# Patient Record
Sex: Female | Born: 1995 | Race: Black or African American | Hispanic: No | Smoking: Never smoker
Health system: Southern US, Community
[De-identification: ages and names within clinical notes are randomized; demographics above are authoritative.]

---
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
## 2017-01-05 ENCOUNTER — Ambulatory Visit (HOSPITAL_COMMUNITY)
Admission: EM | Admit: 2017-01-05 | Discharge: 2017-01-05 | Disposition: A | Discharge status: Home / Self Care | Attending: Internal Medicine | Admitting: Internal Medicine

## 2017-01-05 ENCOUNTER — Encounter (HOSPITAL_COMMUNITY): Payer: Self-pay | Admitting: Emergency Medicine

## 2017-01-05 DIAGNOSIS — R69 Illness, unspecified: Secondary | ICD-10-CM | POA: Diagnosis not present

## 2017-01-05 DIAGNOSIS — J111 Influenza due to unidentified influenza virus with other respiratory manifestations: Secondary | ICD-10-CM

## 2017-01-05 MED ORDER — OSELTAMIVIR PHOSPHATE 75 MG PO CAPS
75.0000 mg | ORAL_CAPSULE | Freq: Two times a day (BID) | ORAL | 0 refills | Status: AC
Start: 2017-01-05 — End: ?

## 2017-01-05 MED ORDER — ONDANSETRON 4 MG PO TBDP
4.0000 mg | ORAL_TABLET | Freq: Three times a day (TID) | ORAL | 0 refills | Status: AC | PRN
Start: 2017-01-05 — End: ?

## 2017-01-05 MED ORDER — BENZONATATE 100 MG PO CAPS: 100.0000 mg | ORAL_CAPSULE | Freq: Three times a day (TID) | ORAL | 0 refills | Status: AC

## 2017-01-05 NOTE — Discharge Instructions (Signed)
You most likely have a viral illness such as influenza, or an influenza-like illness, I advise rest, plenty of fluids and management of symptoms with over the counter medicines. For symptoms you may take Tylenol as needed every 4-6 hours for body aches or fever, not to exceed 4,000 mg a day, Take mucinex or mucinex DM ever 12 hours with a full glass of water, you may use an inhaled steroid such as Flonase, 2 sprays each nostril once a day for congestion, or an antihistamine such as Claritin or Zyrtec once a day. For cough, I have prescribed a medication called Tessalon. Take 1 tablet every 8 hours as needed for your cough. For treatment of influenza, I have prescribed Tamiflu. Take 1 tablet twice a day for 5 days. For Nausea, I have prescribed Zofran, take 1 tablet under the tongue every 8 hours as needed. Should your symptoms worsen or fail to resolve, follow up with your primary care provider or return to clinic.

## 2017-01-05 NOTE — ED Provider Notes (Signed)
CSN: [id number]     Arrival date & time 01/05/17  1952 History   None    Chief Complaint  Patient presents with  . Cough   (Consider location/radiation/quality/duration/timing/severity/associated sxs/prior Treatment) 21 year old female presents to clinic for evaluation of flulike symptoms   The history is provided by the patient.  Cough  Cough characteristics:  Productive and dry Sputum characteristics:  Clear Severity:  Moderate Onset quality:  Gradual Duration:  1 day Timing:  Constant Progression:  Unchanged Chronicity:  New Smoker: no   Context: upper respiratory infection   Relieved by:  Nothing Worsened by:  Nothing Ineffective treatments: Mucinex. Associated symptoms: chills, fever, headaches, myalgias, rhinorrhea, sinus congestion and sore throat   Associated symptoms: no shortness of breath and no wheezing     History reviewed. No pertinent past medical history. History reviewed. No pertinent surgical history. History reviewed. No pertinent family history. Social History  Substance Use Topics  . Smoking status: Never Smoker  . Smokeless tobacco: Not on file  . Alcohol use No   OB History    No data available     Review of Systems  Constitutional: Positive for chills and fever.  HENT: Positive for congestion, rhinorrhea and sore throat.   Eyes: Negative.   Respiratory: Positive for cough. Negative for shortness of breath and wheezing.   Cardiovascular: Negative.   Gastrointestinal: Positive for nausea and vomiting. Negative for abdominal pain and diarrhea.  Genitourinary: Negative.   Musculoskeletal: Positive for myalgias. Negative for neck pain and neck stiffness.  Skin: Negative.   Neurological: Positive for headaches. Negative for dizziness and light-headedness.    Allergies  Patient has no known allergies.  Home Medications   Prior to Admission medications   Medication Sig Start Date End Date Taking? Authorizing Provider  benzonatate  (TESSALON) 100 MG capsule Take 1 capsule (100 mg total) by mouth every 8 (eight) hours. 01/05/17   Dorena Bodo, NP  ondansetron (ZOFRAN ODT) 4 MG disintegrating tablet Take 1 tablet (4 mg total) by mouth every 8 (eight) hours as needed for nausea or vomiting. 01/05/17   Dorena Bodo, NP  oseltamivir (TAMIFLU) 75 MG capsule Take 1 capsule (75 mg total) by mouth every 12 (twelve) hours. 01/05/17   Dorena Bodo, NP   Meds Ordered and Administered this Visit  Medications - No data to display  BP 130/74 (BP Location: Left Arm)   Pulse 84   Temp 99.2 F (37.3 C) (Oral)   Resp 14   SpO2 98%  No data found.   Physical Exam  Constitutional: She is oriented to person, place, and time. She appears well-developed and well-nourished. She appears ill. No distress.  HENT:  Head: Normocephalic and atraumatic.  Right Ear: Tympanic membrane and external ear normal.  Left Ear: Tympanic membrane and external ear normal.  Nose: Nose normal. Right sinus exhibits no maxillary sinus tenderness and no frontal sinus tenderness. Left sinus exhibits no maxillary sinus tenderness and no frontal sinus tenderness.  Mouth/Throat: Uvula is midline and oropharynx is clear and moist. No oropharyngeal exudate.  Eyes: Conjunctivae are normal. Right eye exhibits no discharge. Left eye exhibits no discharge.  Neck: Normal range of motion. Neck supple. No JVD present.  Cardiovascular: Normal rate and regular rhythm.   Pulmonary/Chest: Effort normal and breath sounds normal. No respiratory distress. She has no wheezes.  Abdominal: Soft. Bowel sounds are normal. She exhibits no distension. There is no tenderness. There is no guarding.  Lymphadenopathy:    She has no  cervical adenopathy.  Neurological: She is alert and oriented to person, place, and time.  Skin: Skin is warm and dry. Capillary refill takes less than 2 seconds. She is not diaphoretic.  Psychiatric: She has a normal mood and affect. Her behavior is  normal.  Nursing note and vitals reviewed.   Urgent Care Course     Procedures (including critical care time)  Labs Review Labs Reviewed - No data to display  Imaging Review No results found.     MDM   1. Influenza-like illness     Treating for influenza, given Tamiflu, Tessalon, Zofran, provided counseling on over-the-counter therapies for symptom management. Given school note, advised follow-up with primary care if symptoms persist past one week, return to clinic.     Dorena Bodo, NP 01/05/17 2047

## 2017-01-05 NOTE — ED Triage Notes (Signed)
The patient presented to the [facility] with a complaint of a cough, congestion and fever.

## 2017-01-25 IMAGING — CR DG CHEST 2V
2 series · 2 of 2 positions shown · non-contrast
Comparison: None.

CLINICAL DATA: Cough for 6 days, rule out pneumonia.

EXAM:
CHEST  2 VIEW

[chest pa]
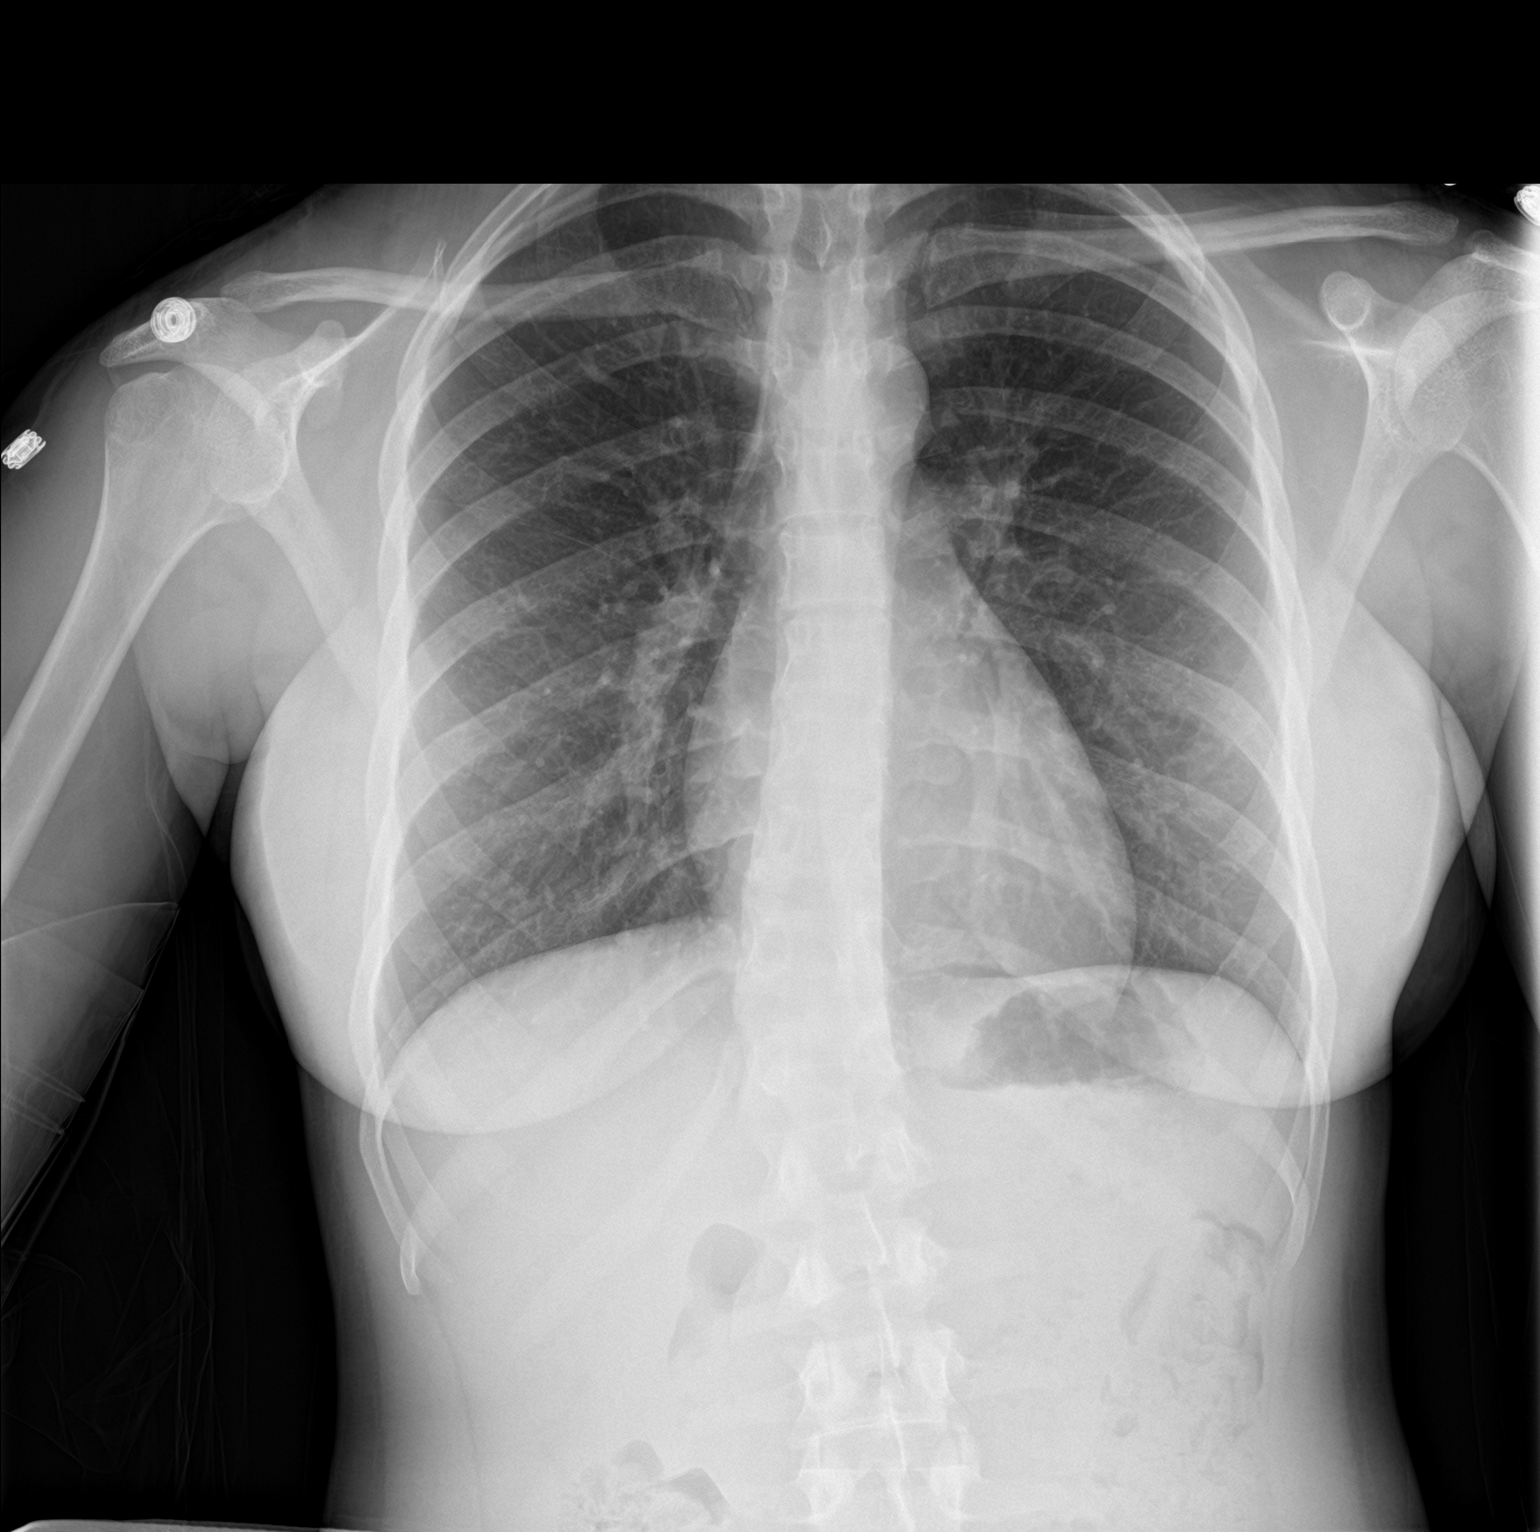

[chest lat]
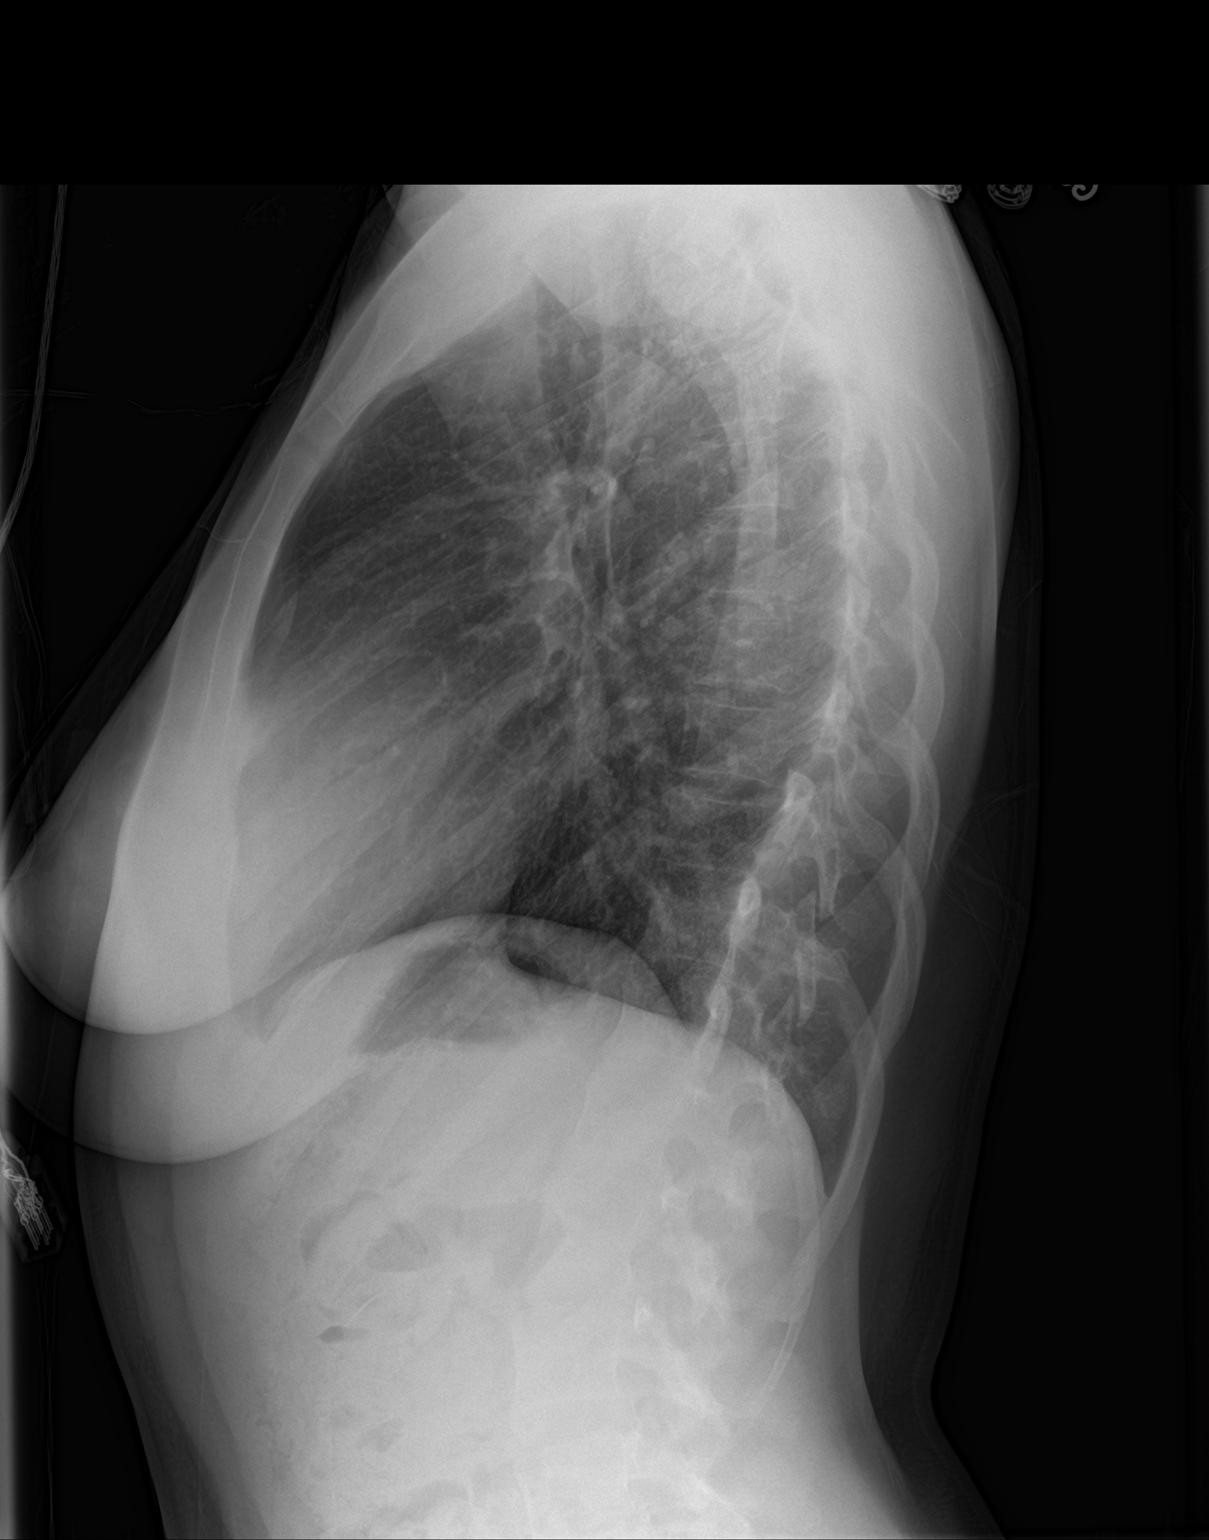

[2 of 2 positions shown; findings below may reference images not displayed]

FINDINGS: The cardiomediastinal contours are normal. The lungs are clear.
Pulmonary vasculature is normal. No consolidation, pleural effusion,
or pneumothorax. No acute osseous abnormalities are seen. Mild
dextroscoliotic curvature of the thoracolumbar spine.
IMPRESSION: 1. Clear lungs.  No pneumonia.
2. Mild dextroscoliotic curvature of the thoracolumbar spine.

## 2018-07-17 ENCOUNTER — Other Ambulatory Visit: Payer: Self-pay

## 2018-07-17 ENCOUNTER — Encounter (HOSPITAL_COMMUNITY): Payer: Self-pay

## 2018-07-17 ENCOUNTER — Emergency Department (HOSPITAL_COMMUNITY)
Admission: EM | Admit: 2018-07-17 | Discharge: 2018-07-18 | Disposition: A | Discharge status: Home / Self Care | Payer: BLUE CROSS/BLUE SHIELD | Attending: Emergency Medicine | Admitting: Emergency Medicine

## 2018-07-17 DIAGNOSIS — R55 Syncope and collapse: Secondary | ICD-10-CM | POA: Diagnosis not present

## 2018-07-17 DIAGNOSIS — R8279 Other abnormal findings on microbiological examination of urine: Secondary | ICD-10-CM | POA: Diagnosis not present

## 2018-07-17 LAB — URINALYSIS, ROUTINE W REFLEX MICROSCOPIC
Bilirubin Urine: NEGATIVE
Glucose, UA: NEGATIVE mg/dL
Hgb urine dipstick: NEGATIVE
Ketones, ur: 20 mg/dL — AB
Nitrite: NEGATIVE
Protein, ur: NEGATIVE mg/dL
Specific Gravity, Urine: 1.027 (ref 1.005–1.030)
pH: 6 (ref 5.0–8.0)

## 2018-07-17 LAB — BASIC METABOLIC PANEL
Anion gap: 8 (ref 5–15)
BUN: 15 mg/dL (ref 6–20)
CO2: 28 mmol/L (ref 22–32)
Calcium: 9.4 mg/dL (ref 8.9–10.3)
Chloride: 103 mmol/L (ref 98–111)
Creatinine, Ser: 0.79 mg/dL (ref 0.44–1.00)
GFR calc Af Amer: >60 mL/min (ref >60)
GFR calc non Af Amer: >60 mL/min (ref >60)
Glucose, Bld: 88 mg/dL (ref 70–99)
Potassium: 3.5 mmol/L (ref 3.5–5.1)
Sodium: 139 mmol/L (ref 135–145)

## 2018-07-17 LAB — I-STAT BETA HCG BLOOD, ED (MC, WL, AP ONLY): I-stat hCG, quantitative: <5 m[IU]/mL (ref <5)

## 2018-07-17 LAB — CBC
HCT: 39.9 % (ref 36.0–46.0)
Hemoglobin: 12.8 g/dL (ref 12.0–15.0)
MCH: 29.2 pg (ref 26.0–34.0)
MCHC: 32.1 g/dL (ref 30.0–36.0)
MCV: 90.9 fL (ref 80.0–100.0)
Platelets: 300 10*3/uL (ref 150–400)
RBC: 4.39 MIL/uL (ref 3.87–5.11)
RDW: 12.4 % (ref 11.5–15.5)
WBC: 7.3 10*3/uL (ref 4.0–10.5)
nRBC: 0 % (ref 0.0–0.2)

## 2018-07-17 LAB — CBG MONITORING, ED: Glucose-Capillary: 72 mg/dL (ref 70–99)

## 2018-07-17 NOTE — ED Provider Notes (Signed)
[facility]-EMERGENCY DEPT Provider Note   CSN: [id number] Arrival date & time: 07/17/18  1510     History   Chief Complaint Chief Complaint  Patient presents with  . Loss of Consciousness    HPI Briana Aguirre is a 22 y.o. female.  The history is provided by the patient and medical records. No language interpreter was used.  Loss of Consciousness     Briana Aguirre is an otherwise healthy 22 y.o. female who presents to the Emergency Department for evaluation following syncopal episode today.  Patient states that she had nothing to eat or drink.  She was actually in line at the grocery store to get herself something to eat when she fainted.  Friend states that she immediately came to within a minute or 2.  She was not confused following.  They deny any seizure-like activity and she has no history of seizures.  No urinary or bowel incontinence.  She states this is happened to her before when she has not had anything to eat throughout the whole day.  After the episode occurred, she got food and she now feels better.  She currently has no complaints.  She never experienced any chest pain or shortness of breath.  Denies family history of sudden cardiac event /death.  Not on OCPs.  No recent travel/surgeries/immobilizations.  No leg swelling or calf pain.   History reviewed. No pertinent past medical history.  There are no active problems to display for this patient.   No past surgical history on file.   OB History   None      Home Medications    Prior to Admission medications   Medication Sig Start Date End Date Taking? Authorizing Provider  benzonatate (TESSALON) 100 MG capsule Take 1 capsule (100 mg total) by mouth every 8 (eight) hours. Patient not taking: Reported on 07/17/2018 01/05/17   Dorena Bodo, NP  ondansetron (ZOFRAN ODT) 4 MG disintegrating tablet Take 1 tablet (4 mg total) by mouth every 8 (eight) hours as needed for nausea or  vomiting. Patient not taking: Reported on 07/17/2018 01/05/17   Dorena Bodo, NP  oseltamivir (TAMIFLU) 75 MG capsule Take 1 capsule (75 mg total) by mouth every 12 (twelve) hours. Patient not taking: Reported on 07/17/2018 01/05/17   Dorena Bodo, NP    Family History No family history on file.  Social History Social History   Tobacco Use  . Smoking status: Never Smoker  . Smokeless tobacco: Never Used  Substance Use Topics  . Alcohol use: No  . Drug use: No     Allergies   Patient has no known allergies.   Review of Systems Review of Systems  Cardiovascular: Positive for syncope.  Neurological: Positive for syncope.  All other systems reviewed and are negative.    Physical Exam Updated Vital Signs BP 114/72 (BP Location: Right Arm)   Pulse 81   Temp 98.5 F (36.9 C) (Oral)   Resp 16   Ht 5\' 3"  (1.6 m)   Wt 59 kg   LMP 06/18/2018 (Exact Date)   SpO2 100%   BMI 23.03 kg/m   Physical Exam  Constitutional: She is oriented to person, place, and time. She appears well-developed and well-nourished. No distress.  HENT:  Head: Normocephalic and atraumatic.  Cardiovascular: Normal rate, regular rhythm and normal heart sounds.  No murmur heard. Pulmonary/Chest: Effort normal and breath sounds normal. No respiratory distress.  Abdominal: Soft. She exhibits no distension. There is no tenderness.  Musculoskeletal:  5/5 muscle strength in upper and lower extremities bilaterally including strong and equal grip strength and dorsiflexion/plantar flexion  Neurological: She is alert and oriented to person, place, and time.  Alert, oriented, thought content appropriate, able to give a coherent history. Speech is clear and goal oriented, able to follow commands.  Cranial Nerves:  II:  Peripheral visual fields grossly normal, pupils equal, round, reactive to light III, IV, VI: EOM intact bilaterally, ptosis not present V,VII: smile symmetric, eyes kept closed tightly  against resistance, facial light touch sensation equal VIII: hearing grossly normal IX, X: symmetric soft palate movement, uvula elevates symmetrically  XI: bilateral shoulder shrug symmetric and strong XII: midline tongue extension Sensory to light touch normal in all four extremities.  Normal finger-to-nose and rapid alternating movements; normal gait and balance. Negative romberg, no pronator drift.  Skin: Skin is warm and dry.  Nursing note and vitals reviewed.    ED Treatments / Results  Labs (all labs ordered are listed, but only abnormal results are displayed) Labs Reviewed  URINALYSIS, ROUTINE W REFLEX MICROSCOPIC - Abnormal; Notable for the following components:      Result Value   APPearance HAZY (*)    Ketones, ur 20 (*)    Leukocytes, UA MODERATE (*)    Bacteria, UA RARE (*)    All other components within normal limits  URINE CULTURE  BASIC METABOLIC PANEL  CBC  CBG MONITORING, ED  I-STAT BETA HCG BLOOD, ED ([facility], [facility], [facility] ONLY)    EKG EKG Interpretation  Date/Time:  Saturday July 17 2018 15:25:57 EDT Ventricular Rate:  58 PR Interval:    QRS Duration: 81 QT Interval:  423 QTC Calculation: 416 R Axis:   82 Text Interpretation:  Sinus rhythm Confirmed by Raeford Razor [phone]) on 07/17/2018 7:16:51 PM   Radiology No results found.  Procedures Procedures (including critical care time)  Medications Ordered in ED Medications - No data to display   Initial Impression / Assessment and Plan / ED Course  I have reviewed the triage vital signs and the nursing notes.  Pertinent labs & imaging results that were available during my care of the patient were reviewed by me and considered in my medical decision making (see chart for details).    Britton Perkinson is a 22 y.o. female who presents to ED for evaluation of syncopal episode just prior to arrival.  On exam, patient is afebrile, hemodynamically stable with no focal neuro deficits and benign cardiopulmonary  exam.  0 on [place] CT head rule.  Do not feel that imaging of the head is warranted.  PE considered.  She is PERC negative.  Again never had any chest pain or shortness of breath prior to syncopal episode.  EKG normal sinus rhythm. Labs wdl. Patient with no personal or family history of sudden, young cardiac death. No hx of CHF. No complaints of chest pain or shortness of breath. Low risk per [facility] Syncope Rule.  UA does show moderate leukocytes, 1-50 WBCs, rare bacteria, although also shows 11-20 squamous cells.  She is not experiencing any urinary symptoms.  Will send for culture, but do not feel that treatment is needed at this time given likely not a clean-catch and she is asymptomatic.   Evaluation does not show pathology that would require ongoing emergent intervention or inpatient treatment. Will discharge to home with close PCP follow up. Reasons to return to ER were discussed.   Pt has remained hemodynamically stable throughout their time  in the ED.   BP 114/72 (BP Location: Right Arm)   Pulse 81   Temp 98.5 F (36.9 C) (Oral)   Resp 16   Ht 5\' 3"  (1.6 m)   Wt 59 kg   LMP 06/18/2018 (Exact Date)   SpO2 100%   BMI 23.03 kg/m   Patient understands return precautions and follow up care. All questions answered.   Final Clinical Impressions(s) / ED Diagnoses   Final diagnoses:  Syncope, unspecified syncope type    ED Discharge Orders    None       Melysa Schroyer, Chase Picket, PA-C 07/17/18 2254    Raeford Razor, MD 07/18/18 1546

## 2018-07-17 NOTE — ED Notes (Signed)
Patient made aware a urine sample is needed in triage.

## 2018-07-17 NOTE — Discharge Instructions (Addendum)
It was my pleasure taking care of you today!   Follow-up with your primary care provider.  If you do not have one, please see the information below.  Return to the emergency department for chest pain, trouble breathing, another syncopal episode, new symptoms, any additional concerns.  To find a primary care or specialty doctor please call [phone] or [phone] to access "[facility] Find a Doctor Service."  You may also go on the [facility] website at [email or web address]  There are also multiple [facility] and [facility] practices throughout the [place] that are frequently accepting new patients. You may find a clinic that is close to your home and contact them.  [facility] - [address] [place] [address] [phone]  [facility] in [place] (also locations in [place] and [place]) - [address] [employer] [place] [phone]  [facility] - [address] [place] [phone]

## 2018-07-17 NOTE — ED Triage Notes (Signed)
She reports "passed out" at a local grocer while waiting in line. She is awake, alert and in no distress and makes a cell phone call upon arrival to E.D.

## 2018-07-19 LAB — URINE CULTURE: CULTURE: NO GROWTH
